# Patient Record
Sex: Female | Born: 1950 | Race: White | Hispanic: No | Marital: Married | Smoking: Never smoker
Health system: Southern US, Community
[De-identification: ages and names within clinical notes are randomized; demographics above are authoritative.]

## PROBLEM LIST (undated history)

## (undated) DIAGNOSIS — I10 Essential (primary) hypertension: Secondary | ICD-10-CM

## (undated) DIAGNOSIS — F419 Anxiety disorder, unspecified: Secondary | ICD-10-CM

## (undated) DIAGNOSIS — I671 Cerebral aneurysm, nonruptured: Secondary | ICD-10-CM

## (undated) DIAGNOSIS — K219 Gastro-esophageal reflux disease without esophagitis: Secondary | ICD-10-CM

## (undated) HISTORY — PX: BRAIN SURGERY: SHX531

## (undated) HISTORY — PX: TONSILLECTOMY: SUR1361

## (undated) HISTORY — PX: APPENDECTOMY: SHX54

---

## 2008-11-24 ENCOUNTER — Ambulatory Visit: Payer: Self-pay | Admitting: Family Medicine

## 2008-11-24 DIAGNOSIS — S20219A Contusion of unspecified front wall of thorax, initial encounter: Secondary | ICD-10-CM | POA: Insufficient documentation

## 2008-11-24 DIAGNOSIS — S2239XA Fracture of one rib, unspecified side, initial encounter for closed fracture: Secondary | ICD-10-CM | POA: Insufficient documentation

## 2008-11-24 DIAGNOSIS — I10 Essential (primary) hypertension: Secondary | ICD-10-CM | POA: Insufficient documentation

## 2008-11-24 DIAGNOSIS — Z85828 Personal history of other malignant neoplasm of skin: Secondary | ICD-10-CM | POA: Insufficient documentation

## 2011-09-29 ENCOUNTER — Ambulatory Visit
Admission: RE | Admit: 2011-09-29 | Discharge: 2011-09-29 | Disposition: A | Discharge status: Home / Self Care | Payer: BC Managed Care – PPO | Source: Ambulatory Visit | Attending: Emergency Medicine | Admitting: Emergency Medicine

## 2011-09-29 ENCOUNTER — Encounter: Payer: Self-pay | Admitting: Emergency Medicine

## 2011-09-29 ENCOUNTER — Inpatient Hospital Stay (INDEPENDENT_AMBULATORY_CARE_PROVIDER_SITE_OTHER)
Admission: RE | Admit: 2011-09-29 | Discharge: 2011-09-29 | Disposition: A | Discharge status: Home / Self Care | Payer: BC Managed Care – PPO | Source: Ambulatory Visit | Attending: Emergency Medicine | Admitting: Emergency Medicine

## 2011-09-29 ENCOUNTER — Other Ambulatory Visit: Payer: Self-pay | Admitting: Emergency Medicine

## 2011-09-29 DIAGNOSIS — M25579 Pain in unspecified ankle and joints of unspecified foot: Secondary | ICD-10-CM

## 2011-11-17 NOTE — Progress Notes (Signed)
Summary: Hurt Ankle rm 5   Vital Signs:  Patient Profile:   60 Years Old Female CC:      RT ankle injury x 4-5 days Height:     63.5 inches Weight:      203.25 pounds O2 Sat:      97 % O2 treatment:    Room Air Temp:     98.4 degrees F oral Pulse rate:   74 / minute Resp:     16 per minute BP sitting:   148 / 78  (left arm) Cuff size:   regular  Pt. in pain?   yes    Location:   ankle    Intensity:   3    Type:       throbbing  Vitals Entered By: Clemens Catholic LPN (September 29, 2011 3:21 PM)                   Updated Prior Medication List: WELLBUTRIN XL 150 MG XR24H-TAB (BUPROPION HCL) 1 tab by mouth qd TOPROL XL 25 MG XR24H-TAB (METOPROLOL SUCCINATE) 1 tab by mouth once daily PRILOSEC OTC 20 MG TBEC (OMEPRAZOLE MAGNESIUM) 1 tab by mouth once daily VITAMIN D 16109 UNIT CAPS (ERGOCALCIFEROL)   Current Allergies (reviewed today): No known allergies History of Present Illness Chief Complaint: RT ankle injury x 4-5 days History of Present Illness: Ankle pain for a few days.  Slipped on the stairs on her boat, twisted her ankle. Swelling and pain located outside of R ankle.  Feeling slightly better.  Ice helps.  REVIEW OF SYSTEMS Constitutional Symptoms      Denies fever, chills, night sweats, weight loss, weight gain, and fatigue.  Eyes       Denies change in vision, eye pain, eye discharge, glasses, contact lenses, and eye surgery. Ear/Nose/Throat/Mouth       Denies hearing loss/aids, change in hearing, ear pain, ear discharge, dizziness, frequent runny nose, frequent nose bleeds, sinus problems, sore throat, hoarseness, and tooth pain or bleeding.  Respiratory       Denies dry cough, productive cough, wheezing, shortness of breath, asthma, bronchitis, and emphysema/COPD.  Cardiovascular       Denies murmurs, chest pain, and tires easily with exhertion.    Gastrointestinal       Denies stomach pain, nausea/vomiting, diarrhea, constipation, blood in bowel  movements, and indigestion. Genitourniary       Denies painful urination, kidney stones, and loss of urinary control. Neurological       Denies paralysis, seizures, and fainting/blackouts. Musculoskeletal       Denies muscle pain, joint pain, joint stiffness, decreased range of motion, redness, swelling, muscle weakness, and gout.  Skin       Denies bruising, unusual mles/lumps or sores, and hair/skin or nail changes.  Psych       Denies mood changes, temper/anger issues, anxiety/stress, speech problems, depression, and sleep problems. Other Comments: pt c/o RT ankle injury x 4-5 days. she has applied ice/heat and taken Aleve.   Past History:  Past Medical History: Reviewed history from 11/24/2008 and no changes required. Skin cancer, hx of Hypertension  Past Surgical History: Reviewed history from 11/24/2008 and no changes required. Appendectomy Caesarean section Tonsillectomy Aneurysm - Brain  Family History: Reviewed history from 11/24/2008 and no changes required. Family History Other cancer - Brain Parkinson Pulmonary Embolism  Social History: Reviewed history from 11/24/2008 and no changes required. Married Never Smoked Alcohol use-yes - wine Drug use-no Regular exercise-no Physical Exam General  appearance: well developed, well nourished, no acute distress MSE: oriented to time, place, and person R ankle: FROM, full strength, resisted motions not painful. +TTP ATFL.  No TTP medial malleolus, navicular, base of 5th, calcaneus, Achilles, or proximal fibula.  +swelling.  +/-ecchymoses. Distal NV status intact. Assessment New Problems: ANKLE PAIN (ICD-719.47)   Plan New Orders: Est. Patient Level III [99213] T-DG Ankle Complete*R* [73610] Ankle Training Brace/ASO Support [L1902] Planning Comments:   Xray ordered and read by radiology as normal.  Encourage rest, elevation, ACE bandage.  If not improving in another week, refer to SM/ortho.   The patient  and/or caregiver has been counseled thoroughly with regard to medications prescribed including dosage, schedule, interactions, rationale for use, and possible side effects and they verbalize understanding.  Diagnoses and expected course of recovery discussed and will return if not improved as expected or if the condition worsens. Patient and/or caregiver verbalized understanding.   Orders Added: 1)  Est. Patient Level III [44034] 2)  T-DG Ankle Complete*R* [73610] 3)  Ankle Training Brace/ASO Support [L1902]

## 2014-10-20 ENCOUNTER — Emergency Department (INDEPENDENT_AMBULATORY_CARE_PROVIDER_SITE_OTHER): Payer: BC Managed Care – PPO

## 2014-10-20 ENCOUNTER — Encounter: Payer: Self-pay | Admitting: Emergency Medicine

## 2014-10-20 ENCOUNTER — Emergency Department
Admission: EM | Admit: 2014-10-20 | Discharge: 2014-10-20 | Disposition: A | Discharge status: Home / Self Care | Payer: BC Managed Care – PPO | Source: Home / Self Care | Attending: Family Medicine | Admitting: Family Medicine

## 2014-10-20 DIAGNOSIS — R509 Fever, unspecified: Secondary | ICD-10-CM

## 2014-10-20 DIAGNOSIS — R05 Cough: Secondary | ICD-10-CM

## 2014-10-20 DIAGNOSIS — J189 Pneumonia, unspecified organism: Secondary | ICD-10-CM

## 2014-10-20 HISTORY — DX: Essential (primary) hypertension: I10

## 2014-10-20 HISTORY — DX: Anxiety disorder, unspecified: F41.9

## 2014-10-20 HISTORY — DX: Cerebral aneurysm, nonruptured: I67.1

## 2014-10-20 HISTORY — DX: Gastro-esophageal reflux disease without esophagitis: K21.9

## 2014-10-20 LAB — POCT CBC W AUTO DIFF (K'VILLE URGENT CARE)

## 2014-10-20 LAB — POCT INFLUENZA A/B
INFLUENZA A, POC: NEGATIVE
INFLUENZA B, POC: NEGATIVE

## 2014-10-20 MED ORDER — LEVOFLOXACIN 500 MG PO TABS
500.0000 mg | ORAL_TABLET | Freq: Every day | ORAL | Status: DC
Start: 2014-10-20 — End: 2018-03-26

## 2014-10-20 NOTE — ED Provider Notes (Signed)
Toni Hartman is a 63 y.o. female who presents to Urgent Care today for Fever and cough. Patient is enema cough and congestion for 2 weeks but 3 days ago she got worse. She developed a high fever and continues to cough. The cough is productive. No vomiting or diarrhea. She's has not tried any medications yet. She denies any personal history for asthma.   Past Medical History  Diagnosis Date  . Brain aneurysm   . Hypertension   . Anxiety   . GERD (gastroesophageal reflux disease)    Past Surgical History  Procedure Laterality Date  . Brain surgery    . Appendectomy    . Tonsillectomy     History  Substance Use Topics  . Smoking status: Never Smoker   . Smokeless tobacco: Not on file  . Alcohol Use: Yes   ROS as above Medications: No current facility-administered medications for this encounter.   Current Outpatient Prescriptions  Medication Sig Dispense Refill  . atorvastatin (LIPITOR) 20 MG tablet Take 20 mg by mouth daily.    Marland Kitchen. buPROPion (WELLBUTRIN XL) 300 MG 24 hr tablet Take 300 mg by mouth daily.    . metoprolol (LOPRESSOR) 50 MG tablet Take 50 mg by mouth 2 (two) times daily.    Marland Kitchen. omeprazole (PRILOSEC) 10 MG capsule Take 10 mg by mouth daily.    Marland Kitchen. levofloxacin (LEVAQUIN) 500 MG tablet Take 1 tablet (500 mg total) by mouth daily. 10 tablet 0   No Known Allergies   Exam:  BP 142/85 mmHg  Pulse 93  Temp(Src) 101.6 F (38.7 C) (Oral)  Resp 1  Ht 5\' 4"  (1.626 m)  Wt 199 lb (90.266 kg)  BMI 34.14 kg/m2  SpO2 96% Gen: Well NAD HEENT: EOMI,  MMM Lungs: Normal work of breathing. CTABL Heart: RRR no MRG Abd: NABS, Soft. Nondistended, Nontender Exts: Brisk capillary refill, warm and well perfused.   Results for orders placed or performed during the hospital encounter of 10/20/14 (from the past 24 hour(s))  POCT CBC w auto diff ([facility])     Status: Normal   Collection Time: 10/20/14  5:57 PM  Result Value Ref Range   WBC  4.5 - 10.5 K/uL   Lymphocytes  relative %  15 - 45 %   Monocytes relative %  2 - 10 %   Neutrophils relative % (GR)  44 - 76 %   Lymphocytes absolute  0.1 - 1.8 K/uL   Monocyes absolute  0.1 - 1 K/uL   Neutrophils absolute (GR#)  1.7 - 7.8 K/uL   RBC  3.8 - 5.1 MIL/uL   Hemoglobin  11.8 - 15.5 g/dL   Hematocrit  16.134.8 - 46 %   MCV  78 - 100 fL   MCH  26 - 32 pg   MCHC  32 - 36.5 g/dL   RDW  09.611.6 - 14 %   Platelet count  140 - 400 K/uL   MPV  7.8 - 11 fL  POCT Influenza A/B     Status: None   Collection Time: 10/20/14  5:58 PM  Result Value Ref Range   Influenza A, POC Negative    Influenza B, POC Negative    Dg Chest 2 View  10/20/2014   ADDENDUM REPORT: 10/20/2014 18:03  ADDENDUM: Given the patient's infectious symptoms, a reasonable alternative would be short term follow-up chest radiographs in 2 weeks after appropriate antimicrobial therapy. If the right perihilar opacity persisted, CT chest with contrast could be  performed at that time.  These results were called by telephone at the time of interpretation on 10/20/2014 at 5:55 pm to Dr. Clementeen GrahamEVAN COREY, who verbally acknowledged these results.   Electronically Signed   By: Charline BillsSriyesh  Krishnan M.D.   On: 10/20/2014 18:03   10/20/2014   CLINICAL DATA:  Cough, fever  EXAM: CHEST  2 VIEW  COMPARISON:  11/24/2008  FINDINGS: Mass-like opacity in the right perihilar region.  Lungs otherwise clear.  No pleural effusion or pneumothorax.  The heart is normal in size.  Mild degenerative changes of the visualized thoracolumbar spine.  IMPRESSION: Mass-like opacity in the right perihilar region, possibly reflecting infection. However, CT chest with contrast is suggested for further evaluation.  Electronically Signed: By: Charline BillsSriyesh  Krishnan M.D. On: 10/20/2014 17:51    Assessment and Plan: 63 y.o. female with Community-acquired pneumonia with right perihilar opacity. Opacities most consistent with pneumonia at this time given patient is febrile for several days. Will treat with Levaquin  and report have patient return to clinic in 2 weeks for repeat chest x-ray. However she should worsen she understands to present to the emergency department for further evaluation and management.  Discussed warning signs or symptoms. Please see discharge instructions. Patient expresses understanding.     Rodolph BongEvan S Corey, MD 10/20/14 [id number]

## 2014-10-20 NOTE — Discharge Instructions (Signed)
Thank you for coming in today. Take levaquin daily for 10 days.  Return in 2 weeks for repeat chest xray to make sure the pneumonia and "opacity" on chest xray has improved.  Go to the ER if you get worse.  Take tylenol for pain or fever as needed.    Pneumonia Pneumonia is an infection of the lungs.  CAUSES Pneumonia may be caused by bacteria or a virus. Usually, these infections are caused by breathing infectious particles into the lungs (respiratory tract). SIGNS AND SYMPTOMS   Cough.  Fever.  Chest pain.  Increased rate of breathing.  Wheezing.  Mucus production. DIAGNOSIS  If you have the common symptoms of pneumonia, your health care provider will typically confirm the diagnosis with a chest X-ray. The X-ray will show an abnormality in the lung (pulmonary infiltrate) if you have pneumonia. Other tests of your blood, urine, or sputum may be done to find the specific cause of your pneumonia. Your health care provider may also do tests (blood gases or pulse oximetry) to see how well your lungs are working. TREATMENT  Some forms of pneumonia may be spread to other people when you cough or sneeze. You may be asked to wear a mask before and during your exam. Pneumonia that is caused by bacteria is treated with antibiotic medicine. Pneumonia that is caused by the influenza virus may be treated with an antiviral medicine. Most other viral infections must run their course. These infections will not respond to antibiotics.  HOME CARE INSTRUCTIONS   Cough suppressants may be used if you are losing too much rest. However, coughing protects you by clearing your lungs. You should avoid using cough suppressants if you can.  Your health care provider may have prescribed medicine if he or she thinks your pneumonia is caused by bacteria or influenza. Finish your medicine even if you start to feel better.  Your health care provider may also prescribe an expectorant. This loosens the mucus to be  coughed up.  Take medicines only as directed by your health care provider.  Do not smoke. Smoking is a common cause of bronchitis and can contribute to pneumonia. If you are a smoker and continue to smoke, your cough may last several weeks after your pneumonia has cleared.  A cold steam vaporizer or humidifier in your room or home may help loosen mucus.  Coughing is often worse at night. Sleeping in a semi-upright position in a recliner or using a couple pillows under your head will help with this.  Get rest as you feel it is needed. Your body will usually let you know when you need to rest. PREVENTION A pneumococcal shot (vaccine) is available to prevent a common bacterial cause of pneumonia. This is usually suggested for:  People over 65 years old.  Patients on chemotherapy.  People with chronic lung problems, such as bronchitis or emphysema.  People with immune system problems. If you are over 65 or have a high risk condition, you may receive the pneumococcal vaccine if you have not received it before. In some countries, a routine influenza vaccine is also recommended. This vaccine can help prevent some cases of pneumonia.You may be offered the influenza vaccine as part of your care. If you smoke, it is time to quit. You may receive instructions on how to stop smoking. Your health care provider can provide medicines and counseling to help you quit. SEEK MEDICAL CARE IF: You have a fever. SEEK IMMEDIATE MEDICAL CARE IF:  Your illness becomes worse. This is especially true if you are elderly or weakened from any other disease.  You cannot control your cough with suppressants and are losing sleep.  You begin coughing up blood.  You develop pain which is getting worse or is uncontrolled with medicines.  Any of the symptoms which initially brought you in for treatment are getting worse rather than better.  You develop shortness of breath or chest pain. MAKE SURE YOU:    Understand these instructions.  Will watch your condition.  Will get help right away if you are not doing well or get worse. Document Released: 12/01/2005 Document Revised: 04/17/2014 Document Reviewed: 02/20/2011 [facility] Patient Information 2015 [employer] [place], [place]. This information is not intended to replace advice given to you by your health care provider. Make sure you discuss any questions you have with your health care provider.

## 2014-10-20 NOTE — ED Notes (Signed)
Reports URI x 2 weeks; granddaughter had pneumonia and she is worried she might now have it; history of fever x 3 days with cough that leads to chest pain. No recent OTCs.

## 2016-04-16 IMAGING — CR DG CHEST 2V
2 series · 2 of 2 positions shown · IV contrast (agent unspecified)
Comparison: 11/24/2008

ADDENDUM:
Given the patient's infectious symptoms, a reasonable alternative
would be short term follow-up chest radiographs in 2 weeks after
appropriate antimicrobial therapy. If the right perihilar opacity
persisted, CT chest with contrast could be performed at that time.

These results were called by telephone at the time of interpretation
on 10/20/2014 at [DATE] to Dr. JALVER SAA, who verbally acknowledged
these results.
CLINICAL DATA: Cough, fever
EXAM:
CHEST  2 VIEW

[view not recorded (1 of 2)]
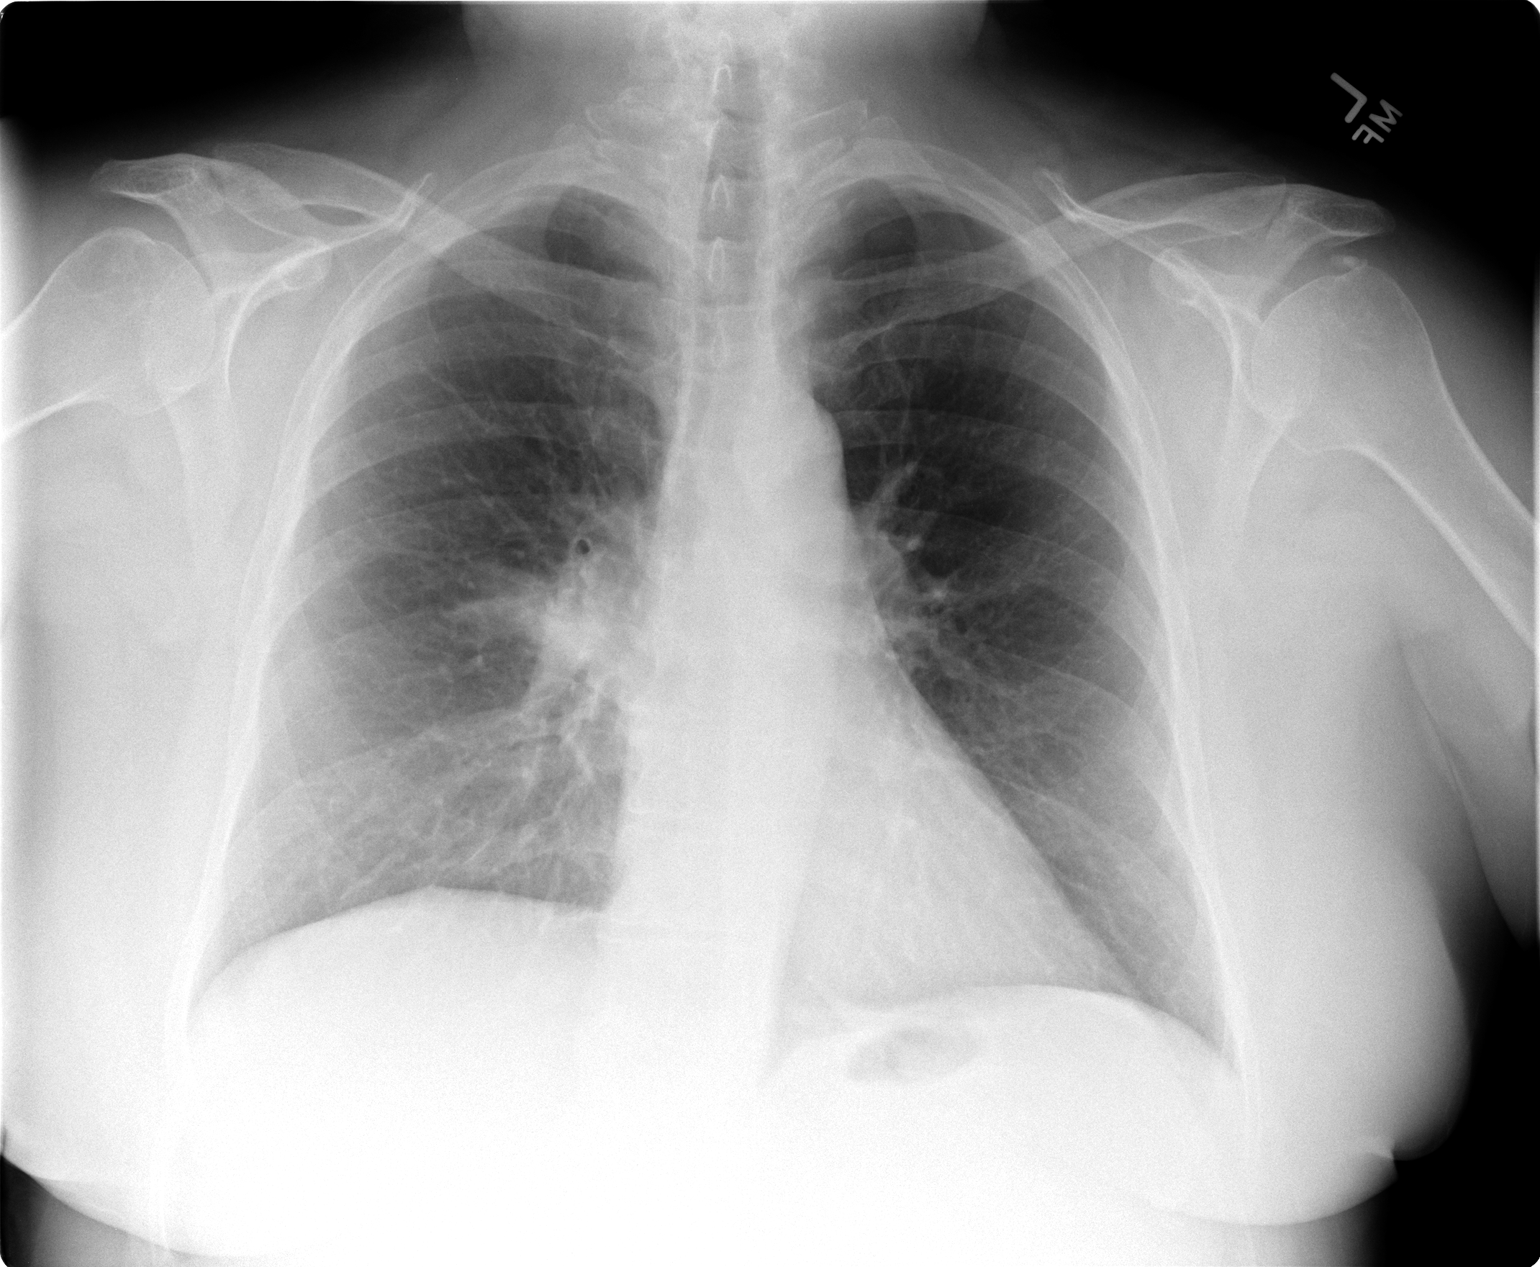

[view not recorded (2 of 2)]
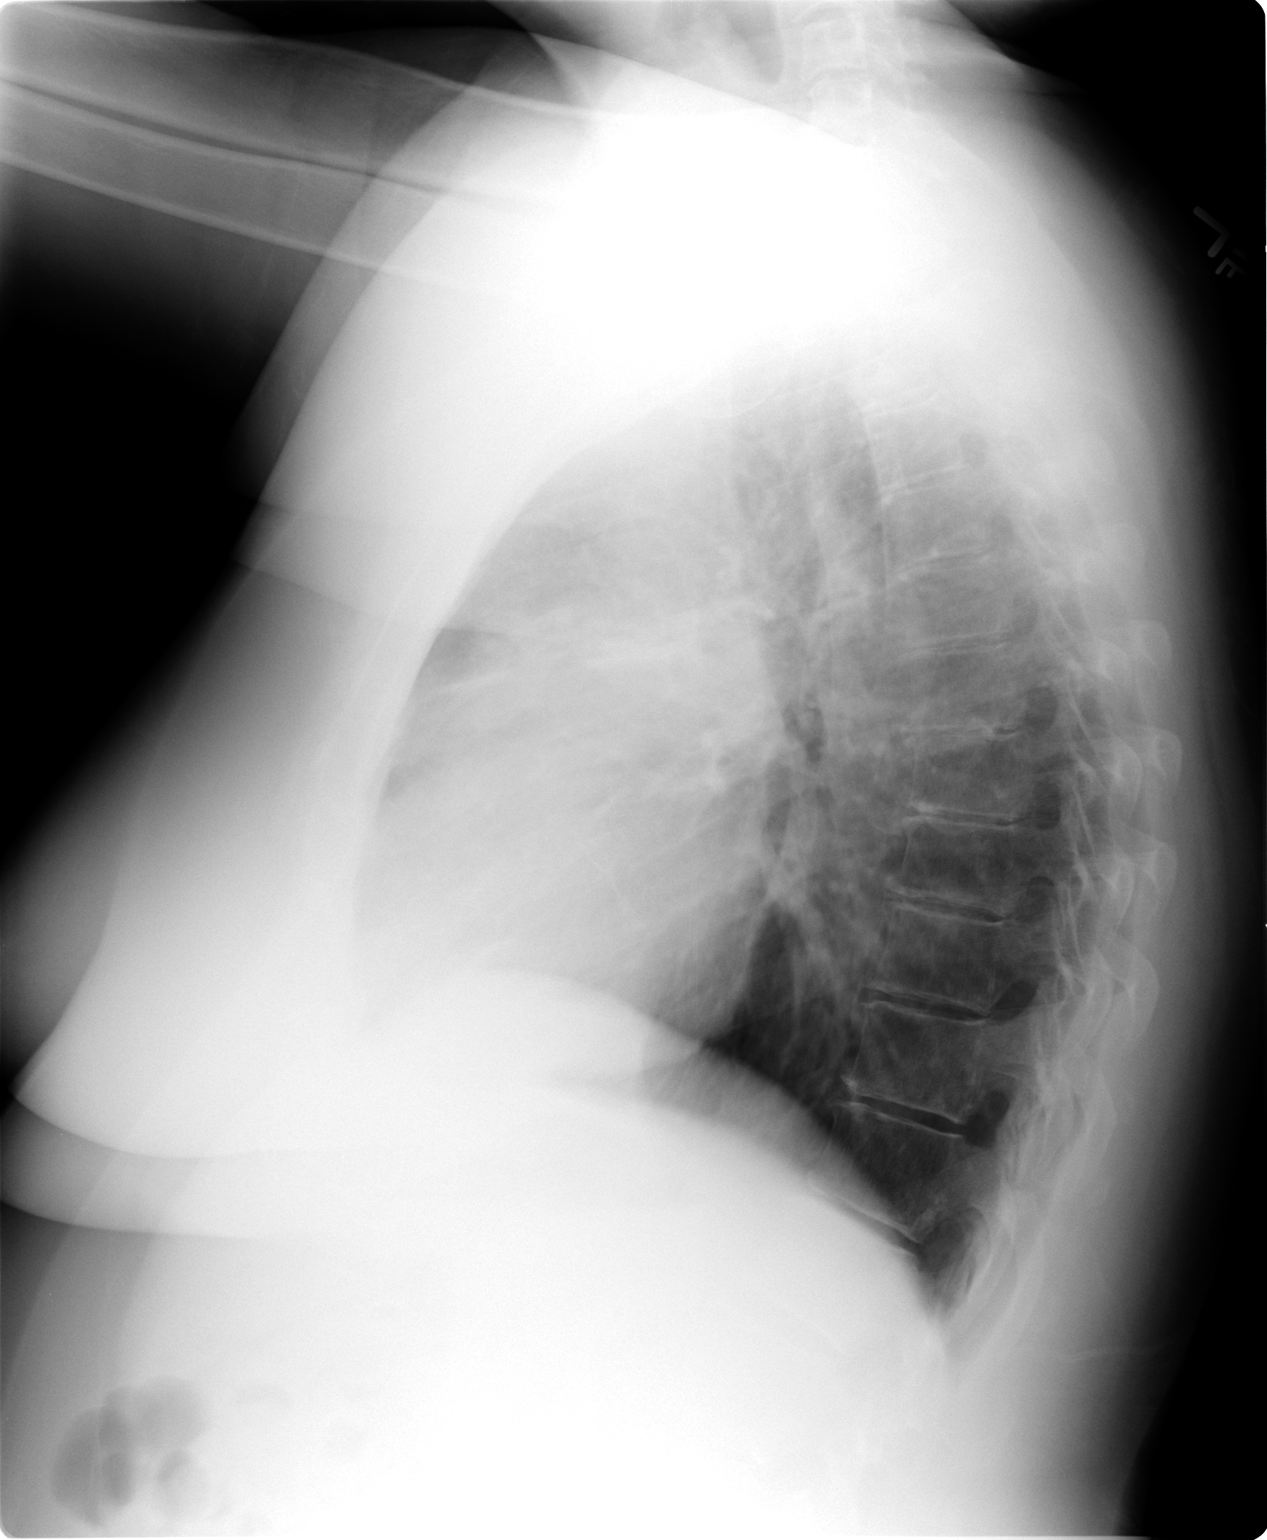

[2 of 2 positions shown; findings below may reference images not displayed]

FINDINGS: Mass-like opacity in the right perihilar region.

Lungs otherwise clear.  No pleural effusion or pneumothorax.

The heart is normal in size.

Mild degenerative changes of the visualized thoracolumbar spine.
IMPRESSION: Mass-like opacity in the right perihilar region, possibly reflecting
infection. However, CT chest with contrast is suggested for further
evaluation.

## 2018-03-26 ENCOUNTER — Other Ambulatory Visit: Payer: Self-pay

## 2018-03-26 ENCOUNTER — Emergency Department
Admission: EM | Admit: 2018-03-26 | Discharge: 2018-03-26 | Disposition: A | Discharge status: Home / Self Care | Payer: Medicare Other | Source: Home / Self Care

## 2018-03-26 DIAGNOSIS — H1031 Unspecified acute conjunctivitis, right eye: Secondary | ICD-10-CM | POA: Diagnosis not present

## 2018-03-26 DIAGNOSIS — H65111 Acute and subacute allergic otitis media (mucoid) (sanguinous) (serous), right ear: Secondary | ICD-10-CM

## 2018-03-26 DIAGNOSIS — J40 Bronchitis, not specified as acute or chronic: Secondary | ICD-10-CM | POA: Diagnosis not present

## 2018-03-26 MED ORDER — HYDROCODONE-HOMATROPINE 5-1.5 MG/5ML PO SYRP: 5.0000 mL | ORAL_SOLUTION | Freq: Four times a day (QID) | ORAL | 0 refills | Status: AC | PRN

## 2018-03-26 MED ORDER — AMOXICILLIN 875 MG PO TABS: 875.0000 mg | ORAL_TABLET | Freq: Two times a day (BID) | ORAL | 0 refills | Status: AC

## 2018-03-26 MED ORDER — POLYMYXIN B-TRIMETHOPRIM 10000-0.1 UNIT/ML-% OP SOLN: 1.0000 [drp] | OPHTHALMIC | 0 refills | Status: AC

## 2018-03-26 NOTE — ED Triage Notes (Signed)
Pt c/o productive cough w/ green phlegm and chest tightness x 1 week. Also c/o eye watering constantly.

## 2018-03-26 NOTE — ED Provider Notes (Signed)
[facility]   [id number] 03/26/18 Arrival Time: 1624   SUBJECTIVE:  Toni Hartman is a 66 y.o. female who presents to the urgent care with complaint of cough for at least a week.  She works in Network engineer used by [employer]  Patient has no history of asthma she does not smoke.  The cough is starting to keep her awake at night and she is now producing green phlegm.  She also noticed that her hearing is diminished in the right ear over the last 24 hours  She has no abdominal symptoms such as nausea or vomiting, no sore throat.  Patient developed redness in her right eye along with mattering and matting this morning.     Past Medical History:  Diagnosis Date  . Anxiety   . Brain aneurysm   . GERD (gastroesophageal reflux disease)   . Hypertension    Family History  Problem Relation Age of Onset  . Cancer Mother   . Hypertension Mother   . Stroke Mother   . Heart failure Father   . Hypertension Father   . Hypertension Brother    Social History   Socioeconomic History  . Marital status: Married    Spouse name: Not on file  . Number of children: Not on file  . Years of education: Not on file  . Highest education level: Not on file  Occupational History  . Not on file  Social Needs  . Financial resource strain: Not on file  . Food insecurity:    Worry: Not on file    Inability: Not on file  . Transportation needs:    Medical: Not on file    Non-medical: Not on file  Tobacco Use  . Smoking status: Never Smoker  . Smokeless tobacco: Never Used  Substance and Sexual Activity  . Alcohol use: Yes  . Drug use: No  . Sexual activity: Not on file  Lifestyle  . Physical activity:    Days per week: Not on file    Minutes per session: Not on file  . Stress: Not on file  Relationships  . Social connections:    Talks on phone: Not on file    Gets together: Not on file    Attends religious service: Not on file    Active  member of club or organization: Not on file    Attends meetings of clubs or organizations: Not on file    Relationship status: Not on file  . Intimate partner violence:    Fear of current or ex partner: Not on file    Emotionally abused: Not on file    Physically abused: Not on file    Forced sexual activity: Not on file  Other Topics Concern  . Not on file  Social History Narrative  . Not on file   No outpatient medications have been marked as taking for the 03/26/18 encounter [facility] Encounter).   No Known Allergies    ROS: As per HPI, remainder of ROS negative.   OBJECTIVE:   Vitals:   03/26/18 1719 03/26/18 1720  BP: (!) 166/85   Pulse: 76   Resp: 18   Temp: 98.9 F (37.2 C)   TempSrc: Oral   SpO2: 99%   Weight:  198 lb (89.8 kg)  Height:  5\' 4"  (1.626 m)     General appearance: alert; no distress Eyes: PERRL; EOMI; conjunctiva red on the right with diffuse purulent discharge HENT: normocephalic; atraumatic; TMs show  serous changes bilaterally with a red malleus on the right., canal normal, external ears normal without trauma; nasal mucosa normal; oral mucosa normal Neck: supple; no adenopathy Lungs: clear to auscultation bilaterally, few faint rhonchi Heart: regular rate and rhythm Back: no CVA tenderness Extremities: no cyanosis or edema; symmetrical with no gross deformities Skin: warm and dry Neurologic: normal gait; grossly normal Psychological: alert and cooperative; normal mood and affect      Labs:  Results for orders placed or performed during the hospital encounter of 10/20/14  POCT Influenza A/B  Result Value Ref Range   Influenza A, POC Negative    Influenza B, POC Negative   POCT CBC w auto diff ([facility])  Result Value Ref Range   WBC  4.5 - 10.5 K/uL   Lymphocytes relative %  15 - 45 %   Monocytes relative %  2 - 10 %   Neutrophils relative % (GR)  44 - 76 %   Lymphocytes absolute  0.1 - 1.8 K/uL   Monocyes absolute  0.1  - 1 K/uL   Neutrophils absolute (GR#)  1.7 - 7.8 K/uL   RBC  3.8 - 5.1 MIL/uL   Hemoglobin  11.8 - 15.5 g/dL   Hematocrit  16.134.8 - 46 %   MCV  78 - 100 fL   MCH  26 - 32 pg   MCHC  32 - 36.5 g/dL   RDW  09.611.6 - 14 %   Platelet count  140 - 400 K/uL   MPV  7.8 - 11 fL    Labs Reviewed - No data to display  No results found.     ASSESSMENT & PLAN:  1. Bronchitis   2. Acute mucoid otitis media of right ear   3. Acute bacterial conjunctivitis of right eye     Meds ordered this encounter  Medications  . amoxicillin (AMOXIL) 875 MG tablet    Sig: Take 1 tablet (875 mg total) by mouth 2 (two) times daily.    Dispense:  20 tablet    Refill:  0  . HYDROcodone-homatropine (HYDROMET) 5-1.5 MG/5ML syrup    Sig: Take 5 mLs by mouth every 6 (six) hours as needed for cough.    Dispense:  60 mL    Refill:  0  . trimethoprim-polymyxin b (POLYTRIM) ophthalmic solution    Sig: Place 1 drop into the right eye every 4 (four) hours.    Dispense:  10 mL    Refill:  0    Reviewed expectations re: course of current medical issues. Questions answered. Outlined signs and symptoms indicating need for more acute intervention. Patient verbalized understanding. After Visit Summary given.    Procedures:      Elvina SidleLauenstein, Michaelangelo Mittelman, MD 03/26/18 1726

## 2018-12-05 ENCOUNTER — Emergency Department (INDEPENDENT_AMBULATORY_CARE_PROVIDER_SITE_OTHER)
Admission: EM | Admit: 2018-12-05 | Discharge: 2018-12-05 | Disposition: A | Discharge status: Home / Self Care | Payer: Medicare Other | Source: Home / Self Care | Attending: Family Medicine | Admitting: Family Medicine

## 2018-12-05 ENCOUNTER — Other Ambulatory Visit: Payer: Self-pay

## 2018-12-05 DIAGNOSIS — R3129 Other microscopic hematuria: Secondary | ICD-10-CM | POA: Diagnosis not present

## 2018-12-05 DIAGNOSIS — R82998 Other abnormal findings in urine: Secondary | ICD-10-CM | POA: Diagnosis not present

## 2018-12-05 DIAGNOSIS — R1031 Right lower quadrant pain: Secondary | ICD-10-CM | POA: Diagnosis not present

## 2018-12-05 LAB — POCT URINALYSIS DIP (MANUAL ENTRY)
Bilirubin, UA: NEGATIVE
Glucose, UA: NEGATIVE mg/dL
Ketones, POC UA: NEGATIVE mg/dL
Nitrite, UA: NEGATIVE
Protein Ur, POC: NEGATIVE mg/dL
Spec Grav, UA: >=1.03 — AB (ref 1.010–1.025)
Urobilinogen, UA: 0.2 E.U./dL
pH, UA: 5 (ref 5.0–8.0)

## 2018-12-05 MED ORDER — CEPHALEXIN 500 MG PO CAPS: 500.0000 mg | ORAL_CAPSULE | Freq: Two times a day (BID) | ORAL | 0 refills | Status: AC

## 2018-12-05 NOTE — Discharge Instructions (Signed)
°  Please take your antibiotic as prescribed. A urine culture has been sent to check the severity of your urinary infection and to determine if you are on the most appropriate antibiotic. The results should come back within 2-3 days and you will be notified even if no medication change is needed. ° °Please stay well hydrated and follow up with your family doctor in 1 week if not improving, sooner if worsening. ° °You may try over the counter medication called Azo to help with bladder spasms.  This medication can make your urine orange, which is normal.  ° °

## 2018-12-05 NOTE — ED Triage Notes (Signed)
Pt c/o RLQ abdominal pain x 4 days. Had appendix removed in 1966. Drinking cranberry x 3 days. Pain 5/10.

## 2018-12-05 NOTE — ED Provider Notes (Signed)
[facility]    CSN: [id number] Arrival date & time: 12/05/18  1509     History   Chief Complaint Chief Complaint  Patient presents with  . Abdominal Pain    RLQ    HPI Toni Hartman is a 67 y.o. female.   HPI Toni Hartman is a 67 y.o. female presenting to UC with c/o RLQ abdominal pain that started about 4 days ago.  Pain is sharp and cramping at times. Hx of appendicitis, appendix removed in 1966. Pain feels similar. Pain is 5/10. Denies fever, chills, n/v/d. Pain does worsen a little when urinating but no burning at urethra. Denies back pain. She has tried cranberry juice w/o relief. No hx of kidney stones.   Past Medical History:  Diagnosis Date  . Anxiety   . Brain aneurysm   . GERD (gastroesophageal reflux disease)   . Hypertension     Patient Active Problem List   Diagnosis Date Noted  . ANKLE PAIN 09/29/2011  . HYPERTENSION 11/24/2008  . CLOSED FRACTURE OF RIB, UNSPECIFIED 11/24/2008  . CONTUSION, LEFT RIB 11/24/2008  . SKIN CANCER, HX OF 11/24/2008    Past Surgical History:  Procedure Laterality Date  . APPENDECTOMY    . BRAIN SURGERY    . TONSILLECTOMY      OB History   No obstetric history on file.      Home Medications    Prior to Admission medications   Medication Sig Start Date End Date Taking? Authorizing Provider  amoxicillin (AMOXIL) 875 MG tablet Take 1 tablet (875 mg total) by mouth 2 (two) times daily. 03/26/18   Elvina SidleLauenstein, Kurt, MD  atorvastatin (LIPITOR) 20 MG tablet Take 20 mg by mouth daily.    [provider]  buPROPion (WELLBUTRIN XL) 300 MG 24 hr tablet Take 300 mg by mouth daily.    [provider]  cephALEXin (KEFLEX) 500 MG capsule Take 1 capsule (500 mg total) by mouth 2 (two) times daily. 12/05/18   Lurene ShadowPhelps, Devlin Brink O, PA-C  HYDROcodone-homatropine (HYDROMET) 5-1.5 MG/5ML syrup Take 5 mLs by mouth every 6 (six) hours as needed for cough. 03/26/18   Elvina SidleLauenstein, Kurt, MD  metoprolol  (LOPRESSOR) 50 MG tablet Take 50 mg by mouth 2 (two) times daily.    [provider]  omeprazole (PRILOSEC) 10 MG capsule Take 10 mg by mouth daily.    [provider]  trimethoprim-polymyxin b (POLYTRIM) ophthalmic solution Place 1 drop into the right eye every 4 (four) hours. 03/26/18   Elvina SidleLauenstein, Kurt, MD    Family History Family History  Problem Relation Age of Onset  . Cancer Mother   . Hypertension Mother   . Stroke Mother   . Heart failure Father   . Hypertension Father   . Hypertension Brother     Social History Social History   Tobacco Use  . Smoking status: Never Smoker  . Smokeless tobacco: Never Used  Substance Use Topics  . Alcohol use: Yes  . Drug use: No     Allergies   Patient has no known allergies.   Review of Systems Review of Systems  Constitutional: Negative for chills and fever.  Gastrointestinal: Positive for abdominal pain. Negative for diarrhea, nausea and vomiting.  Genitourinary: Positive for dysuria (pain in RLQ with urination). Negative for flank pain, frequency, hematuria and pelvic pain.  Musculoskeletal: Negative for back pain.  Neurological: Negative for dizziness and headaches.     Physical Exam Triage Vital Signs ED Triage Vitals [12/05/18 1545]  Enc Vitals Group     BP (!) 145/91     Pulse Rate 76     Resp      Temp 99 F (37.2 C)     Temp Source Oral     SpO2 97 %     Weight 192 lb (87.1 kg)     Height 5\' 4"  (1.626 m)     Head Circumference      Peak Flow      Pain Score 5     Pain Loc      Pain Edu?      Excl. in GC?    No data found.  Updated Vital Signs BP (!) 145/91 (BP Location: Left Arm)   Pulse 76   Temp 99 F (37.2 C) (Oral)   Ht 5\' 4"  (1.626 m)   Wt 192 lb (87.1 kg)   SpO2 97%   BMI 32.96 kg/m   Visual Acuity Right Eye Distance:   Left Eye Distance:   Bilateral Distance:    Right Eye Near:   Left Eye Near:    Bilateral Near:     Physical Exam Vitals signs and nursing  note reviewed.  Constitutional:      Appearance: She is well-developed.  HENT:     Head: Normocephalic and atraumatic.  Neck:     Musculoskeletal: Normal range of motion.  Cardiovascular:     Rate and Rhythm: Normal rate and regular rhythm.  Pulmonary:     Effort: Pulmonary effort is normal.     Breath sounds: Normal breath sounds.  Abdominal:     General: Abdomen is flat. There is no distension.     Palpations: There is no mass.     Tenderness: There is abdominal tenderness in the right lower quadrant and left lower quadrant. There is no right CVA tenderness, left CVA tenderness, guarding or rebound.  Musculoskeletal: Normal range of motion.  Skin:    General: Skin is warm and dry.  Neurological:     Mental Status: She is alert and oriented to person, place, and time.  Psychiatric:        Behavior: Behavior normal.      UC Treatments / Results  Labs (all labs ordered are listed, but only abnormal results are displayed) Labs Reviewed  POCT URINALYSIS DIP (MANUAL ENTRY) - Abnormal; Notable for the following components:      Result Value   Spec Grav, UA >=1.030 (*)    Blood, UA trace-intact (*)    Leukocytes, UA Small (1+) (*)    All other components within normal limits  URINE CULTURE    EKG None  Radiology No results found.  Procedures Procedures (including critical care time)  Medications Ordered in UC Medications - No data to display  Initial Impression / Assessment and Plan / UC Course  I have reviewed the triage vital signs and the nursing notes.  Pertinent labs & imaging results that were available during my care of the patient were reviewed by me and considered in my medical decision making (see chart for details).    Abdominal exam not concerning for acute abdomen.  Hx and exam c/w UTI Will start pt on keflex while culture pending Home care info provided  Final Clinical Impressions(s) / UC Diagnoses   Final diagnoses:  Right lower quadrant  abdominal pain  Leukocytes in urine  Microscopic hematuria     Discharge Instructions      Please take your antibiotic as prescribed. A urine culture  has been sent to check the severity of your urinary infection and to determine if you are on the most appropriate antibiotic. The results should come back within 2-3 days and you will be notified even if no medication change is needed.  Please stay well hydrated and follow up with your family doctor in 1 week if not improving, sooner if worsening.  You may try over the counter medication called Azo to help with bladder spasms.  This medication can make your urine orange, which is normal.      ED Prescriptions    Medication Sig Dispense Auth. Provider   cephALEXin (KEFLEX) 500 MG capsule Take 1 capsule (500 mg total) by mouth 2 (two) times daily. 14 capsule Lurene ShadowPhelps, Jaira Canady O, PA-C     Controlled Substance Prescriptions  Controlled Substance Registry consulted? Not Applicable   Rolla Platehelps, Brylan Seubert O, PA-C 12/05/18 1727

## 2018-12-06 LAB — URINE CULTURE
MICRO NUMBER:: 91532103
SPECIMEN QUALITY:: ADEQUATE

## 2018-12-07 ENCOUNTER — Telehealth: Payer: Self-pay

## 2018-12-07 NOTE — Telephone Encounter (Signed)
Left voice message inquiring about patients status. Encouraged patient to call with questions or concerns. Pt notified of neg UCX results.
